# Patient Record
Sex: Female | Born: 1995 | Race: Black or African American | Hispanic: No | Marital: Single | Smoking: Never smoker
Health system: Southern US, Community
[De-identification: ages and names within clinical notes are randomized; demographics above are authoritative.]

## PROBLEM LIST (undated history)

## (undated) DIAGNOSIS — Z9289 Personal history of other medical treatment: Secondary | ICD-10-CM

## (undated) DIAGNOSIS — B009 Herpesviral infection, unspecified: Secondary | ICD-10-CM

## (undated) DIAGNOSIS — N611 Abscess of the breast and nipple: Secondary | ICD-10-CM

## (undated) DIAGNOSIS — Z23 Encounter for immunization: Secondary | ICD-10-CM

## (undated) HISTORY — DX: Encounter for immunization: Z23

## (undated) HISTORY — DX: Abscess of the breast and nipple: N61.1

## (undated) HISTORY — DX: Herpesviral infection, unspecified: B00.9

## (undated) HISTORY — DX: Personal history of other medical treatment: Z92.89

---
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
## 2013-06-26 ENCOUNTER — Ambulatory Visit: Payer: Self-pay | Admitting: Pediatrics

## 2015-07-08 DIAGNOSIS — Z9289 Personal history of other medical treatment: Secondary | ICD-10-CM

## 2015-07-08 HISTORY — DX: Personal history of other medical treatment: Z92.89

## 2015-07-08 LAB — HM PAP SMEAR: HM Pap smear: NEGATIVE

## 2015-09-06 DIAGNOSIS — B009 Herpesviral infection, unspecified: Secondary | ICD-10-CM

## 2015-09-06 HISTORY — DX: Herpesviral infection, unspecified: B00.9

## 2015-12-07 DIAGNOSIS — N611 Abscess of the breast and nipple: Secondary | ICD-10-CM

## 2015-12-07 HISTORY — PX: INTRAUTERINE DEVICE (IUD) INSERTION: SHX5877

## 2015-12-07 HISTORY — DX: Abscess of the breast and nipple: N61.1

## 2016-06-05 HISTORY — PX: BREAST REDUCTION SURGERY: SHX8

## 2016-12-10 DIAGNOSIS — E669 Obesity, unspecified: Secondary | ICD-10-CM | POA: Insufficient documentation

## 2016-12-10 DIAGNOSIS — Z7189 Other specified counseling: Secondary | ICD-10-CM | POA: Insufficient documentation

## 2016-12-10 DIAGNOSIS — Z7185 Encounter for immunization safety counseling: Secondary | ICD-10-CM | POA: Insufficient documentation

## 2017-03-23 ENCOUNTER — Ambulatory Visit (INDEPENDENT_AMBULATORY_CARE_PROVIDER_SITE_OTHER): Payer: Self-pay | Admitting: Certified Nurse Midwife

## 2017-03-23 ENCOUNTER — Encounter: Payer: Self-pay | Admitting: Certified Nurse Midwife

## 2017-03-23 VITALS — BP 120/74 | HR 81 | Ht 66.0 in | Wt 194.0 lb

## 2017-03-23 DIAGNOSIS — Z124 Encounter for screening for malignant neoplasm of cervix: Secondary | ICD-10-CM

## 2017-03-23 DIAGNOSIS — Z01419 Encounter for gynecological examination (general) (routine) without abnormal findings: Secondary | ICD-10-CM

## 2017-03-23 DIAGNOSIS — Z30431 Encounter for routine checking of intrauterine contraceptive device: Secondary | ICD-10-CM

## 2017-03-23 DIAGNOSIS — N6320 Unspecified lump in the left breast, unspecified quadrant: Secondary | ICD-10-CM

## 2017-03-23 DIAGNOSIS — Z113 Encounter for screening for infections with a predominantly sexual mode of transmission: Secondary | ICD-10-CM

## 2017-03-23 NOTE — Progress Notes (Signed)
Gynecology Annual Exam  PCP: Dr Tereasa Coop Chief Complaint:  Chief Complaint  Patient presents with  . Gynecologic Exam    History of Present Illness: Megan Ramirez presents today for her annual exam. She is a 21 year old African American/Black female, G0 P0000, whose LMP was 03/21/2017. Her menses occur every 1 month, they last 4-5 days, are lite flow, on the [place] IUD. She has not had any intermenstrual spotting. She has occasional dysmenorrhea not requiring medication.  The patient's past medical history is notable for HSV. Since her last annual GYN exam dated 07/08/2015, she has had no herpetic outbreaks. IN July 2017 she had a breast reduction which was complicated by postop abscess in the right breast. She currently is undergoing an evaluation for palpitations.  She is sexually active. She is currently using a Skyla IUD for contraception. It was inserted 12/2015.  Mammogram is not applicable. There is no family history of breast cancer. There is no family history of ovarian cancer. The patient does do occ self breast exams.  The patient does not smoke.  The patient does drink occasionally.  The patient does not use illegal drugs.  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 She has gained 20# since her Christean Grief was inserted. She has a BMI of 31.3 kg/m2 (03/23/17).  The patient does get adequate calcium in her diet.  She has completed the Gardasil vaccine series.  She had a cholesterol screen in Jan 2018 which was normal and her hemoglobin A1C was 5.7%.     Review of Systems: Review of Systems  Constitutional: Negative for chills, fever and weight loss.  HENT: Negative for congestion, sinus pain and sore throat.   Eyes: Negative for blurred vision and pain.  Respiratory: Negative for hemoptysis, shortness of breath and wheezing.   Cardiovascular: Positive for palpitations. Negative for chest pain and leg swelling.  Gastrointestinal:  Negative for abdominal pain, blood in stool, diarrhea, heartburn, nausea and vomiting.  Genitourinary: Negative for dysuria, frequency, hematuria and urgency.  Musculoskeletal: Negative for back pain, joint pain and myalgias.  Skin: Negative for itching and rash.  Neurological: Negative for dizziness, tingling and headaches.  Endo/Heme/Allergies: Negative for environmental allergies and polydipsia. Does not bruise/bleed easily.       Negative for hirsutism. Positive for breast tenderness.   Psychiatric/Behavioral: Negative for depression. The patient is nervous/anxious. The patient does not have insomnia.     Past Medical History:  Past Medical History:  Diagnosis Date  . Breast abscess 2017   on left as a complication to breast reduction surgery  . Herpes simplex 09/2015   HSV 1 on culture vulvovaginitis    Past Surgical History:  Past Surgical History:  Procedure Laterality Date  . BREAST REDUCTION SURGERY  06/2016  . INTRAUTERINE DEVICE (IUD) INSERTION  12/2015   Skyla     Family History:  Family History  Problem Relation Age of Onset  . Hypertension Father   . Hypertension Maternal Grandmother   . Diabetes Maternal Grandfather   . Hypertension Maternal Grandfather   . Stroke Maternal Grandfather   . Renal Disease Paternal Grandfather   . Hypertension Paternal Uncle     Social History:  Social History   Social History  . Marital status: Single    Spouse name: N/A  . Number of children: N/A  . Years of education: N/A   Occupational History  . CNA     pediatrics  at [employer]   Social History Main Topics  . Smoking status: Never Smoker  . Smokeless tobacco: Never Used  . Alcohol use Yes     Comment: occasional  . Drug use: No  . Sexual activity: Yes    Partners: Male    Birth control/ protection: IUD   Other Topics Concern  . Not on file   Social History Narrative  . No narrative on file    Allergies:  No Known Allergies  Medications: Prior to  Admission medications   Medication Sig Start Date End Date Taking? Authorizing Provider  Levonorgestrel (SKYLA) 13.5 MG IUD by Intrauterine route.   Yes Historical Provider, MD   Also occasionally taken biotin, a woman's vitamin, and vitamin D3 Physical Exam Vitals: Blood pressure 120/74, pulse 81, height  (1.676 m), weight 194 lb (88 kg), last menstrual period 03/21/2017.  General: NAD HEENT: normocephalic, anicteric Thyroid: no enlargement, no palpable nodules Pulmonary: No increased work of breathing, CTAB Cardiovascular: RRR without murmur Breast: scars from breast reduction, mild tenderness bilaterally, fullness in upper inner breasts bilaterally with L>R; 1-2 cm round, mobile tender mass in left breast at 5o'clock; no skin or nipple retraction present, no nipple discharge.  No axillary, infraclavicular, or supraclavicular lymphadenopathy. Abdomen:  soft, non-tender, non-distended.  Umbilicus without lesions.  No hepatomegaly or masses palpable. No evidence of hernia  Genitourinary:  External: Normal external female genitalia.  Normal urethral meatus, normal Bartholin's and Skene's glands.    Vagina: Normal vaginal mucosa, no evidence of prolapse.    Cervix: Grossly normal in appearance, small amount bleeding, IUD string visible, NO CMT  Uterus: Non-enlarged, mobile, normal contour, midplane  Adnexa: ovaries non-enlarged, no adnexal masses  Rectal: deferred  Lymphatic: no evidence of inguinal lymphadenopathy Extremities: no edema, erythema, or tenderness Neurologic: Grossly intact Psychiatric: mood appropriate, affect full      Assessment: 21 y.o. G0P0000 well woman exam IUD in place Left breast mass  Plan:   1)RTO in 7-10 days for repeat breast exam. If mass persists, will order breast ultrasound +/- surgical consult  2) Paptima done  3) Routine healthcare maintenance including cholesterol, diabetes screening  managed by PCP  Farrel Conners,  CNM

## 2017-03-28 LAB — PAP IG, CT-NG, RFX HPV ALL
Chlamydia, Nuc. Acid Amp: NEGATIVE
GONOCOCCUS BY NUCLEIC ACID AMP: NEGATIVE
PAP SMEAR COMMENT: 0

## 2017-03-30 ENCOUNTER — Encounter: Payer: Self-pay | Admitting: Certified Nurse Midwife

## 2017-03-30 ENCOUNTER — Ambulatory Visit (INDEPENDENT_AMBULATORY_CARE_PROVIDER_SITE_OTHER): Payer: 59 | Admitting: Certified Nurse Midwife

## 2017-03-30 VITALS — BP 112/72 | HR 97 | Ht 66.0 in | Wt 195.0 lb

## 2017-03-30 DIAGNOSIS — N6323 Unspecified lump in the left breast, lower outer quadrant: Secondary | ICD-10-CM | POA: Diagnosis not present

## 2017-03-30 DIAGNOSIS — N6320 Unspecified lump in the left breast, unspecified quadrant: Secondary | ICD-10-CM | POA: Diagnosis not present

## 2017-03-30 DIAGNOSIS — N6322 Unspecified lump in the left breast, upper inner quadrant: Secondary | ICD-10-CM

## 2017-03-30 NOTE — Progress Notes (Signed)
Obstetrics & Gynecology Office Visit   Chief Complaint:  Chief Complaint  Patient presents with  . Breast Problem    follow up breast exam    History of Present Illness:21 year old G0 who presents for a breast exam. She had a left breast mass at 5 o'clock and fullness in the inner upper quadrants of both breasts on a recent annual exam and had just started her menses. She had a breast reduction surgery last year followed by a right breast abscess. She has not been able to feel any breast masses.    Review of Systems:  Review of Systems  Constitutional: Negative for chills and fever.  Respiratory: Negative.   Cardiovascular: Positive for palpitations.  Skin: Negative for rash.     Past Medical History:  Past Medical History:  Diagnosis Date  . Breast abscess 2017   on left as a complication to breast reduction surgery  . Herpes simplex 09/2015   HSV 1 on culture vulvovaginitis    Past Surgical History:  Past Surgical History:  Procedure Laterality Date  . BREAST REDUCTION SURGERY  06/2016  . INTRAUTERINE DEVICE (IUD) INSERTION  12/2015   Skyla    Gynecologic History: Patient's last menstrual period was 03/21/2017 (exact date).  Obstetric History: G0P0000  Family History:  Family History  Problem Relation Age of Onset  . Hypertension Father   . Hypertension Maternal Grandmother   . Diabetes Maternal Grandfather   . Hypertension Maternal Grandfather   . Stroke Maternal Grandfather   . Renal Disease Paternal Grandfather   . Hypertension Paternal Uncle     Social History:  Social History   Social History  . Marital status: Single    Spouse name: N/A  . Number of children: N/A  . Years of education: N/A   Occupational History  . CNA     pediatrics at [employer]   Social History Main Topics  . Smoking status: Never Smoker  . Smokeless tobacco: Never Used  . Alcohol use Yes     Comment: occasional  . Drug use: No  . Sexual activity: Yes    Partners: Male      Birth control/ protection: IUD   Other Topics Concern  . Not on file   Social History Narrative  . No narrative on file    Allergies:  No Known Allergies  Medications: Prior to Admission medications   Medication Sig Start Date End Date Taking? Authorizing Provider  Levonorgestrel (SKYLA) 13.5 MG IUD by Intrauterine route.    Historical Provider, MD    Physical Exam Vitals: BP 112/72   Pulse 97   Ht  (1.676 m)   Wt 195 lb (88.5 kg)   LMP 03/21/2017 (Exact Date)   BMI 31.47 kg/m   Patient's last menstrual period was 03/21/2017 (exact date).  Physical Exam  Constitutional: She appears well-developed and well-nourished. No distress.  Respiratory: Right breast exhibits skin change (scars from breast reduction). Right breast exhibits no inverted nipple, no mass, no nipple discharge and no tenderness. Left breast exhibits mass (4 cm round mass, NT at 10 o'clock; 1-2 cm round mass at 5 o'clock, NT) and skin change (scar from recent breast reduction). Left breast exhibits no inverted nipple, no nipple discharge and no tenderness.    Lymphadenopathy:    She has no axillary adenopathy.       Right: No supraclavicular adenopathy present.       Left: No supraclavicular adenopathy present.  No infraclavicular adenopathy  Assessment: 21 y.o. G0P0000 with left breast mass x 2  Plan: Problem List Items Addressed This Visit    None    Visit Diagnoses    Mass of upper inner quadrant of left breast    -  Primary   Relevant Orders   US BREAST LTD UNI LEFT INC AXILLA   Mass of lower outer quadrant of left breast       Relevant Orders   US BREAST LTD UNI LEFT INC AXILLA     Advised patient that given her age and her exam, this is most likely a benign process. Will certainly evaluate with a ultrasound on the left breast. If ultrasound is concerning, will refer to Dr Lemar Livings for further evaluation.   Farrel Conners, CNM

## 2017-04-11 ENCOUNTER — Ambulatory Visit
Admission: RE | Admit: 2017-04-11 | Discharge: 2017-04-11 | Disposition: A | Discharge status: Home / Self Care | Payer: Self-pay | Source: Ambulatory Visit | Attending: Certified Nurse Midwife | Admitting: Certified Nurse Midwife

## 2017-04-11 DIAGNOSIS — N6322 Unspecified lump in the left breast, upper inner quadrant: Secondary | ICD-10-CM | POA: Insufficient documentation

## 2017-04-11 DIAGNOSIS — N6323 Unspecified lump in the left breast, lower outer quadrant: Secondary | ICD-10-CM | POA: Insufficient documentation

## 2017-08-11 ENCOUNTER — Encounter: Payer: Self-pay | Admitting: Advanced Practice Midwife

## 2017-08-11 ENCOUNTER — Ambulatory Visit (INDEPENDENT_AMBULATORY_CARE_PROVIDER_SITE_OTHER): Payer: BC Managed Care – PPO | Admitting: Advanced Practice Midwife

## 2017-08-11 VITALS — BP 120/80 | HR 60 | Ht 66.0 in | Wt 205.0 lb

## 2017-08-11 DIAGNOSIS — Z30011 Encounter for initial prescription of contraceptive pills: Secondary | ICD-10-CM | POA: Diagnosis not present

## 2017-08-11 DIAGNOSIS — Z30432 Encounter for removal of intrauterine contraceptive device: Secondary | ICD-10-CM | POA: Diagnosis not present

## 2017-08-11 HISTORY — PX: OTHER SURGICAL HISTORY: SHX169

## 2017-08-11 MED ORDER — NORETHIN ACE-ETH ESTRAD-FE 1-20 MG-MCG PO TABS: 1.0000 | ORAL_TABLET | Freq: Every day | ORAL | 11 refills | Status: DC

## 2017-08-11 NOTE — Progress Notes (Signed)
    GYNECOLOGY OFFICE PROCEDURE NOTE  Megan Ramirez is a 21 y.o. G0P0000 here for IUD removal. The patient currently has a Skyla IUD placed on January 2017.  No GYN concerns.  Last pap smear was on April 2018 and was normal. She has had increased odor with the IUD and also has had a regular period since placement. She is mainly concerned about her weight gain since the IUD was placed. She would like to try oral combined pills now.   IUD Removal Patient identified, informed consent performed, consent signed. Time out was performed. Speculum placed in the vagina. The strings of the IUD were grasped and pulled using ring forceps. The IUD was successfully removed in its entirety.  Patient tolerated procedure well.   Patient was given oral contraceptive information.  Megan Ramirez, CNM

## 2017-08-11 NOTE — Patient Instructions (Signed)
Oral Contraception Information Oral contraceptive pills (OCPs) are medicines taken to prevent pregnancy. OCPs work by preventing the ovaries from releasing eggs. The hormones in OCPs also cause the cervical mucus to thicken, preventing the sperm from entering the uterus. The hormones also cause the uterine lining to become thin, not allowing a fertilized egg to attach to the inside of the uterus. OCPs are highly effective when taken exactly as prescribed. However, OCPs do not prevent sexually transmitted diseases (STDs). Safe sex practices, such as using condoms along with the pill, can help prevent STDs. Before taking the pill, you may have a physical exam and Pap test. Your health care provider may order blood tests. The health care provider will make sure you are a good candidate for oral contraception. Discuss with your health care provider the possible side effects of the OCP you may be prescribed. When starting an OCP, it can take 2 to 3 months for the body to adjust to the changes in hormone levels in your body. Types of oral contraception  The combination pill-This pill contains estrogen and progestin (synthetic progesterone) hormones. The combination pill comes in 21-day, 28-day, or 91-day packs. Some types of combination pills are meant to be taken continuously (365-day pills). With 21-day packs, you do not take pills for 7 days after the last pill. With 28-day packs, the pill is taken every day. The last 7 pills are without hormones. Certain types of pills have more than 21 hormone-containing pills. With 91-day packs, the first 84 pills contain both hormones, and the last 7 pills contain no hormones or contain estrogen only.  The minipill-This pill contains the progesterone hormone only. The pill is taken every day continuously. It is very important to take the pill at the same time each day. The minipill comes in packs of 28 pills. All 28 pills contain the hormone. Advantages of oral  contraceptive pills  Decreases premenstrual symptoms.  Treats menstrual period cramps.  Regulates the menstrual cycle.  Decreases a heavy menstrual flow.  May treatacne, depending on the type of pill.  Treats abnormal uterine bleeding.  Treats polycystic ovarian syndrome.  Treats endometriosis.  Can be used as emergency contraception. Things that can make oral contraceptive pills less effective OCPs can be less effective if:  You forget to take the pill at the same time every day.  You have a stomach or intestinal disease that lessens the absorption of the pill.  You take OCPs with other medicines that make OCPs less effective, such as antibiotics, certain HIV medicines, and some seizure medicines.  You take expired OCPs.  You forget to restart the pill on day 7, when using the packs of 21 pills.  Risks associated with oral contraceptive pills Oral contraceptive pills can sometimes cause side effects, such as:  Headache.  Nausea.  Breast tenderness.  Irregular bleeding or spotting.  Combination pills are also associated with a small increased risk of:  Blood clots.  Heart attack.  Stroke.  This information is not intended to replace advice given to you by your health care provider. Make sure you discuss any questions you have with your health care provider. Document Released: 02/12/2003 Document Revised: 04/29/2016 Document Reviewed: 05/13/2013 Elsevier Interactive Patient Education  2018 Elsevier Inc.  

## 2017-12-05 DIAGNOSIS — R079 Chest pain, unspecified: Secondary | ICD-10-CM | POA: Diagnosis not present

## 2017-12-05 DIAGNOSIS — R000 Tachycardia, unspecified: Secondary | ICD-10-CM | POA: Diagnosis not present

## 2017-12-05 DIAGNOSIS — Z79899 Other long term (current) drug therapy: Secondary | ICD-10-CM | POA: Insufficient documentation

## 2017-12-05 DIAGNOSIS — R002 Palpitations: Secondary | ICD-10-CM | POA: Insufficient documentation

## 2017-12-05 NOTE — ED Triage Notes (Signed)
Patient ambulatory to triage with steady gait, without difficulty or distress noted; pt reports feels heart fluttering with hx of same; st wore holter monitor in past which was WNL; denies any accomp symptoms

## 2017-12-06 ENCOUNTER — Encounter: Payer: Self-pay | Admitting: Emergency Medicine

## 2017-12-06 ENCOUNTER — Emergency Department
Admission: EM | Admit: 2017-12-06 | Discharge: 2017-12-06 | Disposition: A | Discharge status: Home / Self Care | Payer: 59 | Attending: Emergency Medicine | Admitting: Emergency Medicine

## 2017-12-06 ENCOUNTER — Other Ambulatory Visit: Payer: Self-pay

## 2017-12-06 ENCOUNTER — Emergency Department: Payer: 59

## 2017-12-06 DIAGNOSIS — R079 Chest pain, unspecified: Secondary | ICD-10-CM | POA: Diagnosis not present

## 2017-12-06 DIAGNOSIS — R002 Palpitations: Secondary | ICD-10-CM

## 2017-12-06 DIAGNOSIS — Z79899 Other long term (current) drug therapy: Secondary | ICD-10-CM | POA: Diagnosis not present

## 2017-12-06 LAB — BASIC METABOLIC PANEL
ANION GAP: 11 (ref 5–15)
BUN: 13 mg/dL (ref 6–20)
CALCIUM: 9.3 mg/dL (ref 8.9–10.3)
CO2: 23 mmol/L (ref 22–32)
CREATININE: 0.77 mg/dL (ref 0.44–1.00)
Chloride: 101 mmol/L (ref 101–111)
GFR calc Af Amer: >60 mL/min (ref >60)
GLUCOSE: 135 mg/dL — AB (ref 65–99)
Potassium: 3.8 mmol/L (ref 3.5–5.1)
Sodium: 135 mmol/L (ref 135–145)

## 2017-12-06 LAB — CBC
HCT: 42.5 % (ref 35.0–47.0)
HEMOGLOBIN: 14.3 g/dL (ref 12.0–16.0)
MCH: 27.9 pg (ref 26.0–34.0)
MCHC: 33.6 g/dL (ref 32.0–36.0)
MCV: 83 fL (ref 80.0–100.0)
PLATELETS: 282 10*3/uL (ref 150–440)
RBC: 5.12 MIL/uL (ref 3.80–5.20)
RDW: 12.7 % (ref 11.5–14.5)
WBC: 7.8 10*3/uL (ref 3.6–11.0)

## 2017-12-06 LAB — TROPONIN I

## 2017-12-06 NOTE — ED Provider Notes (Signed)
[facility] Emergency Department Provider Note ___   First MD Initiated Contact with Patient 12/06/17 0210     (approximate)  I have reviewed the triage vital signs and the nursing notes.   HISTORY  Chief Complaint Palpitations    HPI Megan Ramirez is a 22 y.o. female presents to the emergency department with acute onset of rapid heartbeat with occasional palpitations which patient describes as heart fluttering while driving tonight.  Patient states that she checked her pulse via her apple watch at which point it was 199 at rest.  Patient denies any chest pain no shortness of breath no dizziness or diaphoresis.  Patient denies any lower extremity pain or swelling.  Of note patient's mother has a history of ventricular tachycardia.  Notable family history of CAD.   Past Medical History:  Diagnosis Date  . Breast abscess 2017   on left as a complication to breast reduction surgery  . Herpes simplex 09/2015   HSV 1 on culture vulvovaginitis    Patient Active Problem List   Diagnosis Date Noted  . Left breast mass 03/30/2017  . Vaccine counseling 12/10/2016  . Obesity (BMI 30-39.9) 12/10/2016    Past Surgical History:  Procedure Laterality Date  . BREAST REDUCTION SURGERY  06/2016  . INTRAUTERINE DEVICE (IUD) INSERTION  12/2015   Skyla  . iud removal  08/11/2017    Prior to Admission medications   Medication Sig Start Date End Date Taking? Authorizing Provider  norethindrone-ethinyl estradiol (JUNEL FE 1/20) 1-20 MG-MCG tablet Take 1 tablet by mouth daily. 08/11/17   Tresea MallGledhill, Jane, CNM    Allergies No known drug allergies  Family History  Problem Relation Age of Onset  . Hypertension Father   . Hypertension Maternal Grandmother   . Diabetes Maternal Grandfather   . Hypertension Maternal Grandfather   . Stroke Maternal Grandfather   . Renal Disease Paternal Grandfather   . Hypertension Paternal Uncle     Social History Social  History   Tobacco Use  . Smoking status: Never Smoker  . Smokeless tobacco: Never Used  Substance Use Topics  . Alcohol use: Yes    Comment: occasional  . Drug use: No    Review of Systems Constitutional: No fever/chills Eyes: No visual changes. ENT: No sore throat. Cardiovascular: Denies chest pain. Respiratory: Denies shortness of breath. Gastrointestinal: No abdominal pain.  No nausea, no vomiting.  No diarrhea.  No constipation. Genitourinary: Negative for dysuria. Musculoskeletal: Negative for neck pain.  Negative for back pain. Integumentary: Negative for rash. Neurological: Negative for headaches, focal weakness or numbness.   ____________________________________________   PHYSICAL EXAM:  VITAL SIGNS: ED Triage Vitals  Enc Vitals Group     BP 12/06/17 0001 (!) 150/98     Pulse Rate 12/06/17 0001 (!) 108     Resp 12/06/17 0001 20     Temp 12/06/17 0001 98 F (36.7 C)     Temp Source 12/06/17 0001 Oral     SpO2 12/06/17 0001 99 %     Weight 12/05/17 2359 90.7 kg (200 lb)     Height 12/05/17 2359 1.676 m (5\' 6" )     Head Circumference --      Peak Flow --      Pain Score --      Pain Loc --      Pain Edu? --      Excl. in GC? --     Constitutional: Alert and oriented. Well appearing and  in no acute distress. Eyes: Conjunctivae are normal.  Head: Atraumatic. Mouth/Throat: Mucous membranes are moist.  Oropharynx non-erythematous. Neck: No stridor.   Cardiovascular: Tachycardia, regular rhythm. Good peripheral circulation. Grossly normal heart sounds. Respiratory: Normal respiratory effort.  No retractions. Lungs CTAB. Gastrointestinal: Soft and nontender. No distention.  Musculoskeletal: No lower extremity tenderness nor edema. No gross deformities of extremities. Neurologic:  Normal speech and language. No gross focal neurologic deficits are appreciated.  Skin:  Skin is warm, dry and intact. No rash noted. Psychiatric: Mood and affect are normal. Speech  and behavior are normal. ____________________________________________   LABS (all labs ordered are listed, but only abnormal results are displayed)  Labs Reviewed  BASIC METABOLIC PANEL - Abnormal; Notable for the following components:      Result Value   Glucose, Bld 135 (*)    All other components within normal limits  CBC  TROPONIN I  POC URINE PREG, ED   ____________________________________________  EKG  ED ECG REPORT I, [place] N Alajiah Dutkiewicz, the attending physician, personally viewed and interpreted this ECG.   Date: 12/06/2017  EKG Time: 12:21 AM  Rate: 107  Rhythm: Sinus tachycardia  Axis: Normal  Intervals: Normal  ST&T Change: None  ____________________________________________  RADIOLOGY I, Edisto Dewayne Shorter, personally viewed and evaluated these images (plain radiographs) as part of my medical decision making, as well as reviewing the written report by the radiologist.  Dg Chest 2 View  Result Date: 12/06/2017 CLINICAL DATA:  Heart palpitations.  Chest pain. EXAM: CHEST  2 VIEW COMPARISON:  None. FINDINGS: The heart size and mediastinal contours are within normal limits. Both lungs are clear. The visualized skeletal structures are unremarkable. IMPRESSION: No active cardiopulmonary disease. Electronically Signed   By: Burman Nieves M.D.   On: 12/06/2017 00:17    ___________________  Procedures   ____________________________________________   INITIAL IMPRESSION / ASSESSMENT AND PLAN / ED COURSE  As part of my medical decision making, I reviewed the following data within the electronic MEDICAL RECORD 52 year old female presented with above-stated history of rapid heartbeat with palpitations.  Patient has a history of previous heart palpitation for which she wore a Holter monitor for 48 hours.  Given family history (mother) of SVT will refer patient to cardiology for further outpatient evaluation and  management. ____________________________________________  FINAL CLINICAL IMPRESSION(S) / ED DIAGNOSES  Final diagnoses:  Heart palpitations     MEDICATIONS GIVEN DURING THIS VISIT:  Medications - No data to display   ED Discharge Orders    None       Note:  This document was prepared using Dragon voice recognition software and may include unintentional dictation errors.    Darci Current, MD 12/06/17 [phone]

## 2017-12-06 NOTE — ED Notes (Signed)
Pt ambulated down hall with heart monitor with this RN assisting. Pt's HR got up to 121bpm. Pt assisted back in bed and BP cycled. MD notified

## 2017-12-06 NOTE — ED Notes (Signed)
ED Provider at bedside. 

## 2017-12-06 NOTE — ED Notes (Signed)
EDT in triage room for pt protocols, however, pt has been transported to xray

## 2017-12-06 NOTE — ED Notes (Signed)
Pt verbalizes understanding of discharge instructions.

## 2017-12-06 NOTE — ED Notes (Signed)
Pt returns to triage for protocols

## 2017-12-20 DIAGNOSIS — R002 Palpitations: Secondary | ICD-10-CM | POA: Diagnosis not present

## 2017-12-23 DIAGNOSIS — I493 Ventricular premature depolarization: Secondary | ICD-10-CM | POA: Diagnosis not present

## 2017-12-29 DIAGNOSIS — R002 Palpitations: Secondary | ICD-10-CM | POA: Diagnosis not present

## 2018-01-05 DIAGNOSIS — R000 Tachycardia, unspecified: Secondary | ICD-10-CM | POA: Diagnosis not present

## 2018-01-05 DIAGNOSIS — R002 Palpitations: Secondary | ICD-10-CM | POA: Diagnosis not present

## 2018-01-27 DIAGNOSIS — J069 Acute upper respiratory infection, unspecified: Secondary | ICD-10-CM | POA: Diagnosis not present

## 2018-05-15 DIAGNOSIS — Z111 Encounter for screening for respiratory tuberculosis: Secondary | ICD-10-CM | POA: Diagnosis not present

## 2018-05-15 DIAGNOSIS — Z0000 Encounter for general adult medical examination without abnormal findings: Secondary | ICD-10-CM | POA: Diagnosis not present

## 2018-05-23 DIAGNOSIS — Z0000 Encounter for general adult medical examination without abnormal findings: Secondary | ICD-10-CM | POA: Diagnosis not present

## 2018-05-23 DIAGNOSIS — R7303 Prediabetes: Secondary | ICD-10-CM | POA: Insufficient documentation

## 2018-06-01 ENCOUNTER — Other Ambulatory Visit: Payer: Self-pay | Admitting: Advanced Practice Midwife

## 2018-06-01 DIAGNOSIS — Z30011 Encounter for initial prescription of contraceptive pills: Secondary | ICD-10-CM

## 2018-07-25 ENCOUNTER — Ambulatory Visit: Payer: BC Managed Care – PPO | Admitting: Certified Nurse Midwife

## 2018-08-22 ENCOUNTER — Other Ambulatory Visit: Payer: Self-pay | Admitting: Advanced Practice Midwife

## 2018-08-22 DIAGNOSIS — Z23 Encounter for immunization: Secondary | ICD-10-CM | POA: Diagnosis not present

## 2018-08-22 DIAGNOSIS — Z30011 Encounter for initial prescription of contraceptive pills: Secondary | ICD-10-CM

## 2018-08-31 ENCOUNTER — Other Ambulatory Visit: Payer: Self-pay | Admitting: Advanced Practice Midwife

## 2018-08-31 DIAGNOSIS — Z30011 Encounter for initial prescription of contraceptive pills: Secondary | ICD-10-CM

## 2018-08-31 NOTE — Telephone Encounter (Signed)
Please advise 

## 2018-09-01 NOTE — Telephone Encounter (Signed)
Was approved 08/22/18 #[id number].  Pt needs annual exam.

## 2018-11-23 ENCOUNTER — Ambulatory Visit (INDEPENDENT_AMBULATORY_CARE_PROVIDER_SITE_OTHER): Payer: 59 | Admitting: Advanced Practice Midwife

## 2018-11-23 ENCOUNTER — Encounter: Payer: Self-pay | Admitting: Advanced Practice Midwife

## 2018-11-23 ENCOUNTER — Other Ambulatory Visit: Payer: Self-pay

## 2018-11-23 VITALS — BP 128/70 | HR 80 | Ht 66.0 in | Wt 216.0 lb

## 2018-11-23 DIAGNOSIS — Z01419 Encounter for gynecological examination (general) (routine) without abnormal findings: Secondary | ICD-10-CM | POA: Diagnosis not present

## 2018-11-23 DIAGNOSIS — Z3041 Encounter for surveillance of contraceptive pills: Secondary | ICD-10-CM

## 2018-11-23 DIAGNOSIS — Z0000 Encounter for general adult medical examination without abnormal findings: Secondary | ICD-10-CM

## 2018-11-23 MED ORDER — NORGESTIMATE-ETH ESTRADIOL 0.25-35 MG-MCG PO TABS: 1.0000 | ORAL_TABLET | Freq: Every day | ORAL | 4 refills | Status: DC

## 2018-11-23 NOTE — Patient Instructions (Signed)
American Heart Association (AHA) Exercise Recommendation  Being physically active is important to prevent heart disease and stroke, the nation's No. 1and No. 5killers. To improve overall cardiovascular health, we suggest at least 150 minutes per week of moderate exercise or 75 minutes per week of vigorous exercise (or a combination of moderate and vigorous activity). Thirty minutes a day, five times a week is an easy goal to remember. You will also experience benefits even if you divide your time into two or three segments of 10 to 15 minutes per day.  For people who would benefit from lowering their blood pressure or cholesterol, we recommend 40 minutes of aerobic exercise of moderate to vigorous intensity three to four times a week to lower the risk for heart attack and stroke.  Physical activity is anything that makes you move your body and burn calories.  This includes things like climbing stairs or playing sports. Aerobic exercises benefit your heart, and include walking, jogging, swimming or biking. Strength and stretching exercises are best for overall stamina and flexibility.  The simplest, positive change you can make to effectively improve your heart health is to start walking. It's enjoyable, free, easy, social and great exercise. A walking program is flexible and boasts high success rates because people can stick with it. It's easy for walking to become a regular and satisfying part of life.   For Overall Cardiovascular Health:  At least 30 minutes of moderate-intensity aerobic activity at least 5 days per week for a total of 150  OR   At least 25 minutes of vigorous aerobic activity at least 3 days per week for a total of 75 minutes; or a combination of moderate- and vigorous-intensity aerobic activity  AND   Moderate- to high-intensity muscle-strengthening activity at least 2 days per week for additional health benefits.  For Lowering Blood Pressure and Cholesterol  An  average 40 minutes of moderate- to vigorous-intensity aerobic activity 3 or 4 times per week  What if I can't make it to the time goal? Something is always better than nothing! And everyone has to start somewhere. Even if you've been sedentary for years, today is the day you can begin to make healthy changes in your life. If you don't think you'll make it for 30 or 40 minutes, set a reachable goal for today. You can work up toward your overall goal by increasing your time as you get stronger. Don't let all-or-nothing thinking rob you of doing what you can every day.  Source:http://www.heart.org   Mediterranean Diet A Mediterranean diet refers to food and lifestyle choices that are based on the traditions of countries located on the [employer]. This way of eating has been shown to help prevent certain conditions and improve outcomes for people who have chronic diseases, like kidney disease and heart disease. What are tips for following this plan? Lifestyle  Cook and eat meals together with your family, when possible.  Drink enough fluid to keep your urine clear or pale yellow.  Be physically active every day. This includes: ? Aerobic exercise like running or swimming. ? Leisure activities like gardening, walking, or housework.  Get 7-8 hours of sleep each night.  If recommended by your health care provider, drink red wine in moderation. This means 1 glass a day for nonpregnant women and 2 glasses a day for men. A glass of wine equals 5 oz (150 mL). Reading food labels   Check the serving size of packaged foods. For foods such as  rice and pasta, the serving size refers to the amount of cooked product, not dry.  Check the total fat in packaged foods. Avoid foods that have saturated fat or trans fats.  Check the ingredients list for added sugars, such as corn syrup. Shopping  At the grocery store, buy most of your food from the areas near the walls of the store. This  includes: ? Fresh fruits and vegetables (produce). ? Grains, beans, nuts, and seeds. Some of these may be available in unpackaged forms or large amounts (in bulk). ? Fresh seafood. ? Poultry and eggs. ? Low-fat dairy products.  Buy whole ingredients instead of prepackaged foods.  Buy fresh fruits and vegetables in-season from local farmers markets.  Buy frozen fruits and vegetables in resealable bags.  If you do not have access to quality fresh seafood, buy precooked frozen shrimp or canned fish, such as tuna, salmon, or sardines.  Buy small amounts of raw or cooked vegetables, salads, or olives from the deli or salad bar at your store.  Stock your pantry so you always have certain foods on hand, such as olive oil, canned tuna, canned tomatoes, rice, pasta, and beans. Cooking  Cook foods with extra-virgin olive oil instead of using butter or other vegetable oils.  Have meat as a side dish, and have vegetables or grains as your main dish. This means having meat in small portions or adding small amounts of meat to foods like pasta or stew.  Use beans or vegetables instead of meat in common dishes like chili or lasagna.  Experiment with different cooking methods. Try roasting or broiling vegetables instead of steaming or sauteing them.  Add frozen vegetables to soups, stews, pasta, or rice.  Add nuts or seeds for added healthy fat at each meal. You can add these to yogurt, salads, or vegetable dishes.  Marinate fish or vegetables using olive oil, lemon juice, garlic, and fresh herbs. Meal planning   Plan to eat 1 vegetarian meal one day each week. Try to work up to 2 vegetarian meals, if possible.  Eat seafood 2 or more times a week.  Have healthy snacks readily available, such as: ? Vegetable sticks with hummus. ? [place] yogurt. ? Fruit and nut trail mix.  Eat balanced meals throughout the week. This includes: ? Fruit: 2-3 servings a day ? Vegetables: 4-5 servings a  day ? Low-fat dairy: 2 servings a day ? Fish, poultry, or lean meat: 1 serving a day ? Beans and legumes: 2 or more servings a week ? Nuts and seeds: 1-2 servings a day ? Whole grains: 6-8 servings a day ? Extra-virgin olive oil: 3-4 servings a day  Limit red meat and sweets to only a few servings a month What are my food choices?  Mediterranean diet ? Recommended ? Grains: Whole-grain pasta. Brown rice. Bulgar wheat. Polenta. Couscous. Whole-wheat bread. [place]. ? Vegetables: Artichokes. Beets. Broccoli. Cabbage. Carrots. Eggplant. Green beans. Chard. Kale. Spinach. Onions. Leeks. Peas. Squash. Tomatoes. Peppers. Radishes. ? Fruits: Apples. Apricots. Avocado. Berries. Bananas. Cherries. Dates. Figs. Grapes. Lemons. Melon. Oranges. Peaches. Plums. Pomegranate. ? Meats and other protein foods: Beans. Almonds. Sunflower seeds. Pine nuts. Peanuts. [place]. Salmon. Scallops. Shrimp. [place]. Tilapia. Clams. Oysters. Eggs. ? Dairy: Low-fat milk. Cheese. Greek yogurt. ? Beverages: Water. Red wine. Herbal tea. ? Fats and oils: Extra virgin olive oil. Avocado oil. Grape seed oil. ? Sweets and desserts: [place] yogurt with honey. Baked apples. Poached pears. Trail mix. ? Seasoning and other foods: Basil.  Cilantro. Coriander. Cumin. Mint. Parsley. Sage. Rosemary. Tarragon. Garlic. Oregano. Thyme. Pepper. Balsalmic vinegar. Tahini. Hummus. Tomato sauce. Olives. Mushrooms. ? Limit these ? Grains: Prepackaged pasta or rice dishes. Prepackaged cereal with added sugar. ? Vegetables: Deep fried potatoes (french fries). ? Fruits: Fruit canned in syrup. ? Meats and other protein foods: Beef. Pork. Lamb. Poultry with skin. Hot dogs. [place] Salines. ? Dairy: Ice cream. Sour cream. Whole milk. ? Beverages: Juice. Sugar-sweetened soft drinks. Beer. Liquor and spirits. ? Fats and oils: Butter. Canola oil. Vegetable oil. Beef fat (tallow). Lard. ? Sweets and desserts: Cookies. Cakes. Pies. Candy. ? Seasoning and other  foods: Mayonnaise. Premade sauces and marinades. ? The items listed may not be a complete list. Talk with your dietitian about what dietary choices are right for you. Summary  The Mediterranean diet includes both food and lifestyle choices.  Eat a variety of fresh fruits and vegetables, beans, nuts, seeds, and whole grains.  Limit the amount of red meat and sweets that you eat.  Talk with your health care provider about whether it is safe for you to drink red wine in moderation. This means 1 glass a day for nonpregnant women and 2 glasses a day for men. A glass of wine equals 5 oz (150 mL). This information is not intended to replace advice given to you by your health care provider. Make sure you discuss any questions you have with your health care provider. Document Released: 07/15/2016 Document Revised: 08/17/2016 Document Reviewed: 07/15/2016 Elsevier Interactive Patient Education  2019 [place] 18-39 Years, Female Preventive care refers to lifestyle choices and visits with your health care provider that can promote health and wellness. What does preventive care include?   A yearly physical exam. This is also called an annual well check.  Dental exams once or twice a year.  Routine eye exams. Ask your health care provider how often you should have your eyes checked.  Personal lifestyle choices, including: ? Daily care of your teeth and gums. ? Regular physical activity. ? Eating a healthy diet. ? Avoiding tobacco and drug use. ? Limiting alcohol use. ? Practicing safe sex. ? Taking vitamin and mineral supplements as recommended by your health care provider. What happens during an annual well check? The services and screenings done by your health care provider during your annual well check will depend on your age, overall health, lifestyle risk factors, and family history of disease. Counseling Your health care provider may ask you questions about  your:  Alcohol use.  Tobacco use.  Drug use.  Emotional well-being.  Home and relationship well-being.  Sexual activity.  Eating habits.  Work and work Statistician.  Method of birth control.  Menstrual cycle.  Pregnancy history. Screening You may have the following tests or measurements:  Height, weight, and BMI.  Diabetes screening. This is done by checking your blood sugar (glucose) after you have not eaten for a while (fasting).  Blood pressure.  Lipid and cholesterol levels. These may be checked every 5 years starting at age 38.  Skin check.  Hepatitis C blood test.  Hepatitis B blood test.  Sexually transmitted disease (STD) testing.  BRCA-related cancer screening. This may be done if you have a family history of breast, ovarian, tubal, or peritoneal cancers.  Pelvic exam and Pap test. This may be done every 3 years starting at age 43. Starting at age 48, this may be done every 5 years if you have a Pap test in combination with  an HPV test. Discuss your test results, treatment options, and if necessary, the need for more tests with your health care provider. Vaccines Your health care provider may recommend certain vaccines, such as:  Influenza vaccine. This is recommended every year.  Tetanus, diphtheria, and acellular pertussis (Tdap, Td) vaccine. You may need a Td booster every 10 years.  Varicella vaccine. You may need this if you have not been vaccinated.  HPV vaccine. If you are 38 or younger, you may need three doses over 6 months.  Measles, mumps, and rubella (MMR) vaccine. You may need at least one dose of MMR. You may also need a second dose.  Pneumococcal 13-valent conjugate (PCV13) vaccine. You may need this if you have certain conditions and were not previously vaccinated.  Pneumococcal polysaccharide (PPSV23) vaccine. You may need one or two doses if you smoke cigarettes or if you have certain conditions.  Meningococcal vaccine. One  dose is recommended if you are age 60-21 years and a first-year college student living in a residence hall, or if you have one of several medical conditions. You may also need additional booster doses.  Hepatitis A vaccine. You may need this if you have certain conditions or if you travel or work in places where you may be exposed to hepatitis A.  Hepatitis B vaccine. You may need this if you have certain conditions or if you travel or work in places where you may be exposed to hepatitis B.  Haemophilus influenzae type b (Hib) vaccine. You may need this if you have certain risk factors. Talk to your health care provider about which screenings and vaccines you need and how often you need them. This information is not intended to replace advice given to you by your health care provider. Make sure you discuss any questions you have with your health care provider. Document Released: 01/18/2002 Document Revised: 07/05/2017 Document Reviewed: 09/23/2015 Elsevier Interactive Patient Education  2019 [employer].

## 2018-11-23 NOTE — Progress Notes (Signed)
Patient ID: Megan Ramirez, female   DOB: 11/13/1996, 22 y.o.   MRN: [id number]     Gynecology Annual Exam  PCP: [facility]  Chief Complaint:  Chief Complaint  Patient presents with  . Gynecologic Exam    thinks she had a ruptured cyst in August    History of Present Illness: Patient is a 22 y.o. G0P0000 presents for annual exam. The patient has complaint today of decreased libido and is concerned it is due to her birth control. She would like to try a different pill. 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 She also moved to a new house in the past year. Discussed how stress can affect hormones and recommended increasing stress reduction techniques. She did not follow up in August when she had severe pelvic pain. She suspected a ruptured ovarian cyst. She denied any bleeding at the time. She has not had any pain since then.  LMP: Patient's last menstrual period was 11/05/2018. Menarche:not applicable Average Interval: regular, 28 days Duration of flow: 4 days Heavy Menses: no Clots: no Intermenstrual Bleeding: no Postcoital Bleeding: no Dysmenorrhea: no  The patient is sexually active. She currently uses OCP (estrogen/progesterone) for contraception. She denies dyspareunia.  The patient does perform self breast exams.  There is no notable family history of breast or ovarian cancer in her family.  The patient wears seatbelts: yes.  The patient has regular exercise: She walks to class but denies cardio activity. She has made improvements in her diet and has lost 10 pounds with those changes. She does admit adequate hydration. She denies adequate sleep especially due to school.    The patient denies current symptoms of depression.    Review of Systems: Review of Systems  Constitutional: Negative.   HENT: Negative.   Eyes: Negative.   Respiratory: Negative.   Cardiovascular: Negative.   Gastrointestinal: Negative.     Genitourinary: Negative.   Musculoskeletal: Negative.   Skin: Negative.   Neurological: Negative.   Endo/Heme/Allergies: Negative.   Psychiatric/Behavioral: Negative.     Past Medical History:  Past Medical History:  Diagnosis Date  . Breast abscess 2017   on left as a complication to breast reduction surgery  . 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 History of Papanicolaou smear of cervix 07/08/2015   neg, ct/gc/tr neg;  . Immunization, viral disease    gardasil series completed    Past Surgical History:  Past Surgical History:  Procedure Laterality Date  . BREAST REDUCTION SURGERY  06/2016  . INTRAUTERINE DEVICE (IUD) INSERTION  12/2015   Skyla  . iud removal  08/11/2017    Gynecologic History:  Patient's last menstrual period was 11/05/2018. Contraception: OCP (estrogen/progesterone) Last Pap: 1 year ago Results were:  no abnormalities   Obstetric History: G0P0000  Family History:  Family History  Problem Relation Age of Onset  . Hypertension Father   . Hypertension Maternal Grandmother   . Diabetes Maternal Grandfather   . Hypertension Maternal Grandfather   . Stroke Maternal Grandfather   . Renal Disease Paternal Grandfather   . Hypertension Paternal Uncle     Social History:  Social History   Socioeconomic History  . Marital status: Single    Spouse name: Not on file  . Number of children: 0  . Years of education: 6916  . Highest education level: Not on file  Occupational History  . Occupation: CNA    Comment: pediatrics  at [employer]  Social Needs  . Financial resource strain: Not on file  . Food insecurity:    Worry: Not on file    Inability: Not on file  . Transportation needs:    Medical: Not on file    Non-medical: Not on file  Tobacco Use  . Smoking status: Never Smoker  . Smokeless tobacco: Never Used  Substance and Sexual Activity  . Alcohol use: Yes    Comment: occasional  . Drug use: No  . Sexual activity: Yes     Partners: Male    Birth control/protection: Pill  Lifestyle  . Physical activity:    Days per week: 0 days    Minutes per session: Not on file  . Stress: Not on file  Relationships  . Social connections:    Talks on phone: Not on file    Gets together: Not on file    Attends religious service: Not on file    Active member of club or organization: Not on file    Attends meetings of clubs or organizations: Not on file    Relationship status: Not on file  . Intimate partner violence:    Fear of current or ex partner: Not on file    Emotionally abused: Not on file    Physically abused: Not on file    Forced sexual activity: Not on file  Other Topics Concern  . Not on file  Social History Narrative  . Not on file    Allergies:  No Known Allergies  Medications: Prior to Admission medications   Medication Sig Start Date End Date Taking? Authorizing Provider  norgestimate-ethinyl estradiol (ORTHO-CYCLEN,SPRINTEC,PREVIFEM) 0.25-35 MG-MCG tablet Take 1 tablet by mouth daily. 11/23/18   Tresea Mall, CNM    Physical Exam Vitals: Blood pressure 128/70, pulse 80, height 5\' 6"  (1.676 m), weight 216 lb (98 kg), last menstrual period 11/05/2018.  General: NAD HEENT: normocephalic, anicteric Thyroid: no enlargement, no palpable nodules Pulmonary: No increased work of breathing, CTAB Cardiovascular: RRR, distal pulses 2+ Breast: Breast symmetrical, no tenderness, no palpable nodules or masses, no skin or nipple retraction present, no nipple discharge.  No axillary or supraclavicular lymphadenopathy. Abdomen: NABS, soft, non-tender, non-distended.  Umbilicus without lesions.  No hepatomegaly, splenomegaly or masses palpable. No evidence of hernia  Genitourinary: deferred for no concerns/PAP interval/shared decision making Extremities: no edema, erythema, or tenderness Neurologic: Grossly intact Psychiatric: mood appropriate, affect full    Assessment: 22 y.o. G0P0000 routine  annual exam  Plan: Problem List Items Addressed This Visit    None    Visit Diagnoses    Well woman exam without gynecological exam    -  Primary   Encounter for surveillance of contraceptive pills       Relevant Medications   norgestimate-ethinyl estradiol (ORTHO-CYCLEN,SPRINTEC,PREVIFEM) 0.25-35 MG-MCG tablet      1) 4) Gardasil Series discussed and if applicable offered to patient - Patient has not previously completed 3 shot series   2) STI screening  was offered and declined  3)  ASCCP guidelines and rationale discussed.  Patient opts for every 3 years screening interval  4) Contraception - the patient is currently using  OCP (estrogen/progesterone).  She is happy with continuing OCPs but wants to switch to a higher dose. Sprintec ordered. We discussed safe sex practices to reduce her furture risk of STI's.    5) Return in 1 year (on 11/24/2019) for annual established gyn.   Tresea Mall, CNM [facility], [facility]  11/23/2018, 11:01 AM

## 2018-12-13 IMAGING — CR DG CHEST 2V
2 series · 2 of 2 positions shown · non-contrast
Comparison: None.

CLINICAL DATA: Heart palpitations.  Chest pain.

EXAM:
CHEST  2 VIEW

[chest pa]
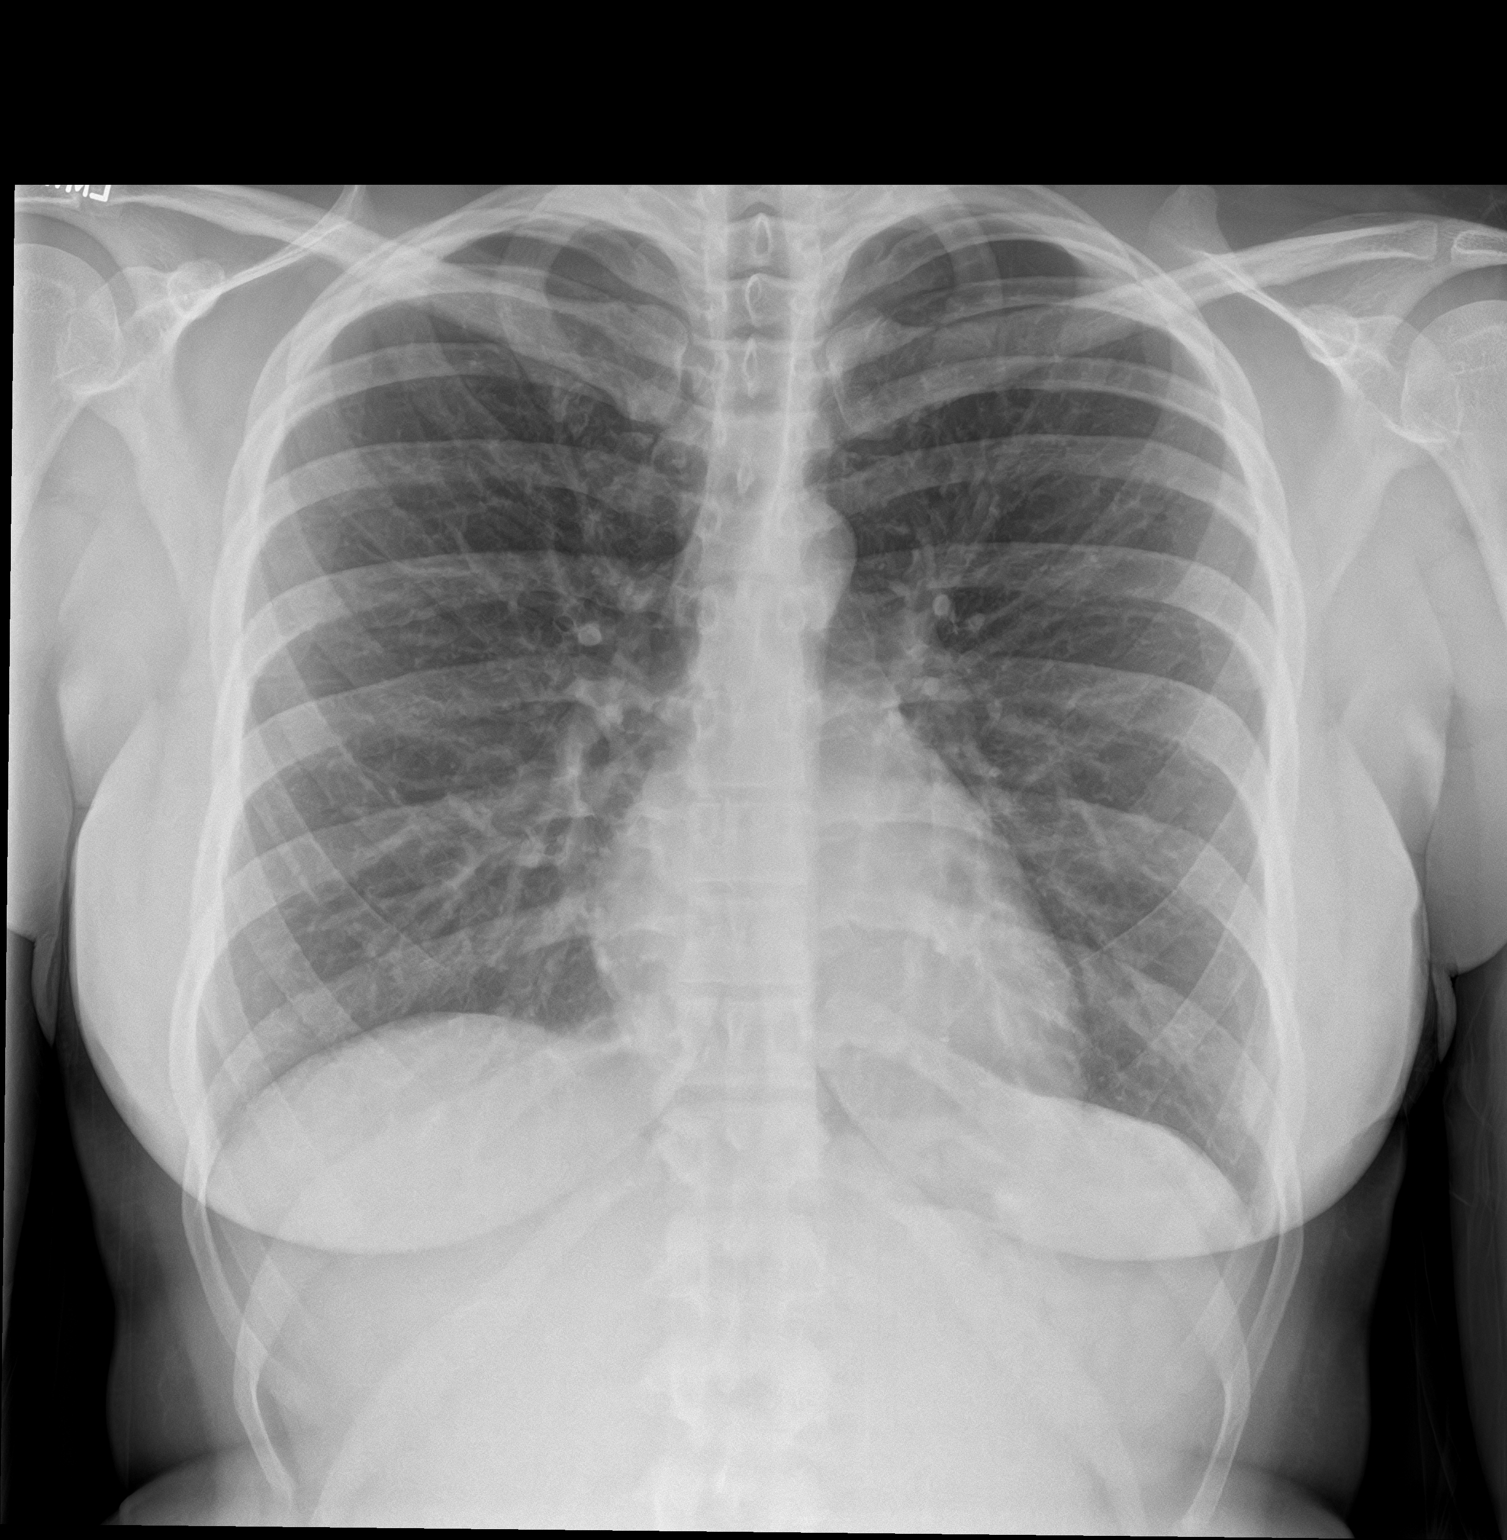

[chest lat]
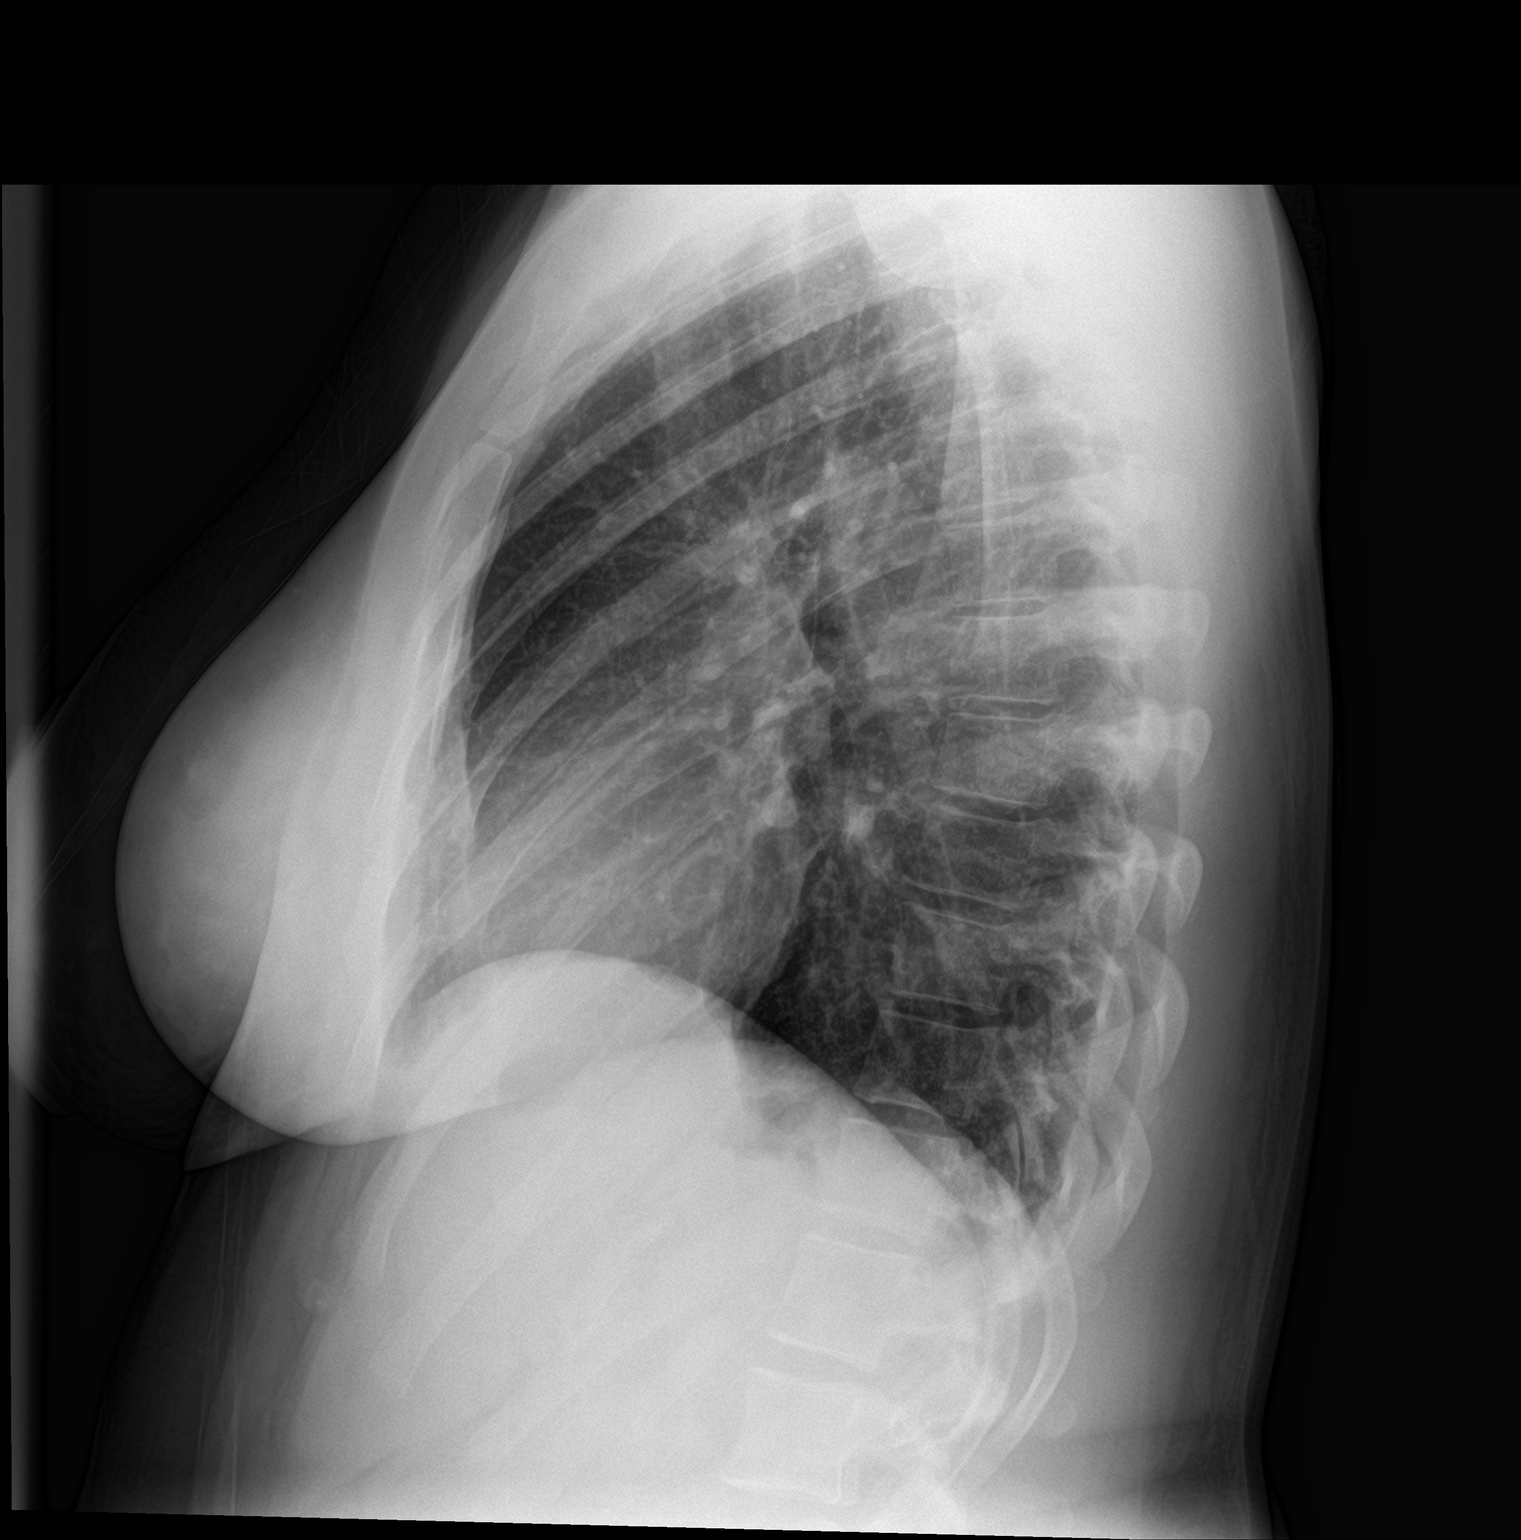

[2 of 2 positions shown; findings below may reference images not displayed]

FINDINGS: The heart size and mediastinal contours are within normal limits.
Both lungs are clear. The visualized skeletal structures are
unremarkable.
IMPRESSION: No active cardiopulmonary disease.

## 2019-11-20 ENCOUNTER — Telehealth: Payer: Self-pay | Admitting: Obstetrics and Gynecology

## 2019-11-20 NOTE — Telephone Encounter (Signed)
Noted. Will order to arrive by apt date/time. 

## 2019-11-20 NOTE — Telephone Encounter (Signed)
Patient is schedule for 12/13/19 in [place] for annual and poss nexplanon placement.

## 2019-12-13 ENCOUNTER — Ambulatory Visit (INDEPENDENT_AMBULATORY_CARE_PROVIDER_SITE_OTHER): Payer: BC Managed Care – PPO | Admitting: Obstetrics and Gynecology

## 2019-12-13 ENCOUNTER — Other Ambulatory Visit: Payer: Self-pay

## 2019-12-13 ENCOUNTER — Other Ambulatory Visit (HOSPITAL_COMMUNITY)
Admission: RE | Admit: 2019-12-13 | Discharge: 2019-12-13 | Disposition: A | Discharge status: Home / Self Care | Payer: BC Managed Care – PPO | Source: Ambulatory Visit | Attending: Obstetrics and Gynecology | Admitting: Obstetrics and Gynecology

## 2019-12-13 ENCOUNTER — Encounter: Payer: Self-pay | Admitting: Obstetrics and Gynecology

## 2019-12-13 VITALS — BP 153/93 | Ht 66.0 in | Wt 232.0 lb

## 2019-12-13 DIAGNOSIS — Z1339 Encounter for screening examination for other mental health and behavioral disorders: Secondary | ICD-10-CM

## 2019-12-13 DIAGNOSIS — Z30017 Encounter for initial prescription of implantable subdermal contraceptive: Secondary | ICD-10-CM | POA: Diagnosis not present

## 2019-12-13 DIAGNOSIS — Z113 Encounter for screening for infections with a predominantly sexual mode of transmission: Secondary | ICD-10-CM | POA: Diagnosis present

## 2019-12-13 DIAGNOSIS — Z01419 Encounter for gynecological examination (general) (routine) without abnormal findings: Secondary | ICD-10-CM

## 2019-12-13 DIAGNOSIS — Z1331 Encounter for screening for depression: Secondary | ICD-10-CM

## 2019-12-13 DIAGNOSIS — Z124 Encounter for screening for malignant neoplasm of cervix: Secondary | ICD-10-CM | POA: Diagnosis present

## 2019-12-13 MED ORDER — NEXPLANON 68 MG ~~LOC~~ IMPL: 1.0000 | DRUG_IMPLANT | Freq: Once | SUBCUTANEOUS | 0 refills | Status: AC

## 2019-12-13 NOTE — Progress Notes (Signed)
Gynecology Annual Exam  PCP: [facility]  Chief Complaint:  Chief Complaint  Patient presents with  . Gynecologic Exam    no concerns  . Contraception    Nexplanon insertion    History of Present Illness:  Ms. ALEXX GIAMBRA is a 24 y.o. G0P0000 who LMP was Patient's last menstrual period was 12/12/2019 (exact date)., presents today for her annual examination.  Her menses are regular every 28-30 days, lasting 4 day(s).  Dysmenorrhea none. She does not have intermenstrual bleeding.  She is sexually active.  She uses combined OCPs for contraception. Denies dyspareunia.  Last Pap: 03/2017.  Results were: no abnormalities /neg HPV DNA not done Hx of STDs: none  There is a FH of breast cancer in her paternal grandmother. There is no FH of ovarian cancer. The patient does do self-breast exams.  Tobacco use: The patient denies current or previous tobacco use. Alcohol use: social drinker Exercise: she exercises 3-4 times per week.  The patient wears seatbelts: yes.   The patient reports that domestic violence in her life is absent.   Past Medical History:  Diagnosis Date  . Breast abscess 2017   on left as a complication to breast reduction surgery  . 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 History of Papanicolaou smear of cervix 07/08/2015   neg, ct/gc/tr neg;  . Immunization, viral disease    gardasil series completed    Past Surgical History:  Procedure Laterality Date  . BREAST REDUCTION SURGERY  06/2016  . INTRAUTERINE DEVICE (IUD) INSERTION  12/2015   Skyla  . iud removal  08/11/2017    Prior to Admission medications   Medication Sig Start Date End Date Taking? Authorizing Provider  norgestimate-ethinyl estradiol (ORTHO-CYCLEN,SPRINTEC,PREVIFEM) 0.25-35 MG-MCG tablet Take 1 tablet by mouth daily. 11/23/18   Tresea Mall, CNM    No Known Allergies  Obstetric History: G0P0000  Social History   Socioeconomic History  .  Marital status: Single    Spouse name: Not on file  . Number of children: 0  . Years of education: 11  . Highest education level: Not on file  Occupational History  . Occupation: CNA    Comment: pediatrics at [employer]  Tobacco Use  . Smoking status: Never Smoker  . Smokeless tobacco: Never Used  Substance and Sexual Activity  . Alcohol use: Yes    Comment: occasional  . Drug use: No  . Sexual activity: Yes    Partners: Male    Birth control/protection: Implant  Other Topics Concern  . Not on file  Social History Narrative  . Not on file   Social Determinants of Health   Financial Resource Strain:   . Difficulty of Paying Living Expenses: Not on file  Food Insecurity:   . Worried About Programme researcher, broadcasting/film/video in the Last Year: Not on file  . Ran Out of Food in the Last Year: Not on file  Transportation Needs:   . Lack of Transportation (Medical): Not on file  . Lack of Transportation (Non-Medical): Not on file  Physical Activity:   . Days of Exercise per Week: Not on file  . Minutes of Exercise per Session: Not on file  Stress:   . Feeling of Stress : Not on file  Social Connections:   . Frequency of Communication with Friends and Family: Not on file  . Frequency of Social Gatherings with Friends and Family: Not on file  . Attends Religious Services: Not  on file  . Active Member of Clubs or Organizations: Not on file  . Attends Banker Meetings: Not on file  . Marital Status: Not on file  Intimate Partner Violence:   . Fear of Current or Ex-Partner: Not on file  . Emotionally Abused: Not on file  . Physically Abused: Not on file  . Sexually Abused: Not on file    Family History  Problem Relation Age of Onset  . Hypertension Father   . Hypertension Maternal Grandmother   . Diabetes Maternal Grandfather   . Hypertension Maternal Grandfather   . Stroke Maternal Grandfather   . Renal Disease Paternal Grandfather   . Hypertension Paternal Uncle   . Breast  cancer Paternal Grandmother 33    Review of Systems  Constitutional: Negative.   HENT: Negative.   Eyes: Negative.   Respiratory: Negative.   Cardiovascular: Negative.   Gastrointestinal: Negative.   Genitourinary: Negative.   Musculoskeletal: Negative.   Skin: Negative.   Neurological: Negative.   Psychiatric/Behavioral: Negative.      Physical Exam BP (!) 153/93   Ht 5\' 6"  (1.676 m)   Wt 232 lb (105.2 kg)   LMP 12/12/2019 (Exact Date)   BMI 37.45 kg/m    Physical Exam Constitutional:      General: She is not in acute distress.    Appearance: Normal appearance. She is well-developed.  Genitourinary:     Pelvic exam was performed with patient supine.     Vulva, urethra, bladder and uterus normal.     No inguinal adenopathy present in the right or left side.    No signs of injury in the vagina.     No vaginal discharge, erythema, tenderness or bleeding.     No cervical motion tenderness, discharge, lesion or polyp.     Uterus is mobile.     Uterus is not enlarged or tender.     No uterine mass detected.    Uterus is anteverted.     No right or left adnexal mass present.     Right adnexa not tender or full.     Left adnexa not tender or full.  HENT:     Head: Normocephalic and atraumatic.  Eyes:     General: No scleral icterus.    Conjunctiva/sclera: Conjunctivae normal.  Neck:     Thyroid: No thyromegaly.  Cardiovascular:     Rate and Rhythm: Normal rate and regular rhythm.     Heart sounds: No murmur. No friction rub. No gallop.   Pulmonary:     Effort: Pulmonary effort is normal. No respiratory distress.     Breath sounds: Normal breath sounds. No wheezing or rales.  Chest:     Breasts:        Right: No inverted nipple, mass, nipple discharge, skin change or tenderness.        Left: No inverted nipple, mass, nipple discharge, skin change or tenderness.  Abdominal:     General: Bowel sounds are normal. There is no distension.     Palpations: Abdomen is  soft. There is no mass.     Tenderness: There is no abdominal tenderness. There is no guarding or rebound.  Musculoskeletal:        General: No swelling or tenderness. Normal range of motion.     Cervical back: Normal range of motion and neck supple.  Lymphadenopathy:     Cervical: No cervical adenopathy.     Lower Body: No right inguinal adenopathy. No left  inguinal adenopathy.  Neurological:     General: No focal deficit present.     Mental Status: She is alert and oriented to person, place, and time.     Cranial Nerves: No cranial nerve deficit.  Skin:    General: Skin is warm and dry.     Findings: No erythema or rash.  Psychiatric:        Mood and Affect: Mood normal.        Behavior: Behavior normal.        Judgment: Judgment normal.    GYNECOLOGY PROCEDURE NOTE  Patient is a 24 y.o. G0P0000 presenting for Nexplanon insertion as her desires means of contraception.  She provided informed consent, signed copy in the chart, time out was performed. Pregnancy test was not done as the patient was on her menses, with self reported LMP of Patient's last menstrual period was 12/12/2019 (exact date).  She understands that Nexplanon is a progesterone only therapy, and that patients often patients have irregular and unpredictable vaginal bleeding or amenorrhea. She understands that other side effects are possible related to systemic progesterone, including but not limited to, headaches, breast tenderness, nausea, and irritability. While effective at preventing pregnancy long acting reversible contraceptives do not prevent transmission of sexually transmitted diseases and use of barrier methods for this purpose was discussed. The placement procedure for Nexplanon was reviewed with the patient in detail including risks of nerve injury, infection, bleeding and injury to other muscles or tendons. She understands that the Nexplanon implant is good for 3 years and needs to be removed at the end of that  time.  She understands that Nexplanon is an extremely effective option for contraception, with failure rate of <1%. This information is reviewed today and all questions were answered. Informed consent was obtained, both verbally and written.   The patient is healthy and has no contraindications to Nexplanon use. Urine pregnancy test was performed today and was negative.  Procedure Appropriate time out taken.  Patient placed in dorsal supine with left arm above head, elbow flexed at 90 degrees, arm resting on examination table with hand behind her head.  The bicipital grove was palpated and site 8-10cm proximal to the medial epicondyle was indentified.  Per the manufacturer's recommendations, the insertion site was marked along a line 3-5 cm posterior (toward the triceps) to the bicipital groove and at 8-10 cm medial to the medial epicondyle. The insertion site was prepped with a two betadine swabs and then injected with 2 cc of 1% lidocaine without epinephrine.  Nexplanon removed form sterile blister packaging,  Device confirmed in needle, before inserting full length of needle, tenting up the skin as the needle was advance.  The drug eluting rod was then deployed by pulling back the slider per the manufactures recommendation.  The implant was palpable by the clinician as well as the patient.  The insertion site covered dressed with a 1/2" steri-strip before applying  a kerlex bandage pressure dressing..Minimal blood loss was noted during the procedure.  The patient tolerated the procedure well.   She was instructed to wear the bandage for 24 hours, call with any signs of infection.  She was given the Nexplanon card and instructed to have the rod removed in 3 years.  Female chaperone present for pelvic and breast  portions of the physical exam  Results: AUDIT Questionnaire (screen for alcoholism): 4 PHQ-9: 0   Assessment: 24 y.o. G0P0000 female here for routine annual gynecologic  examination  Plan: Problem  List Items Addressed This Visit    None    Visit Diagnoses    Women's annual routine gynecological examination    -  Primary   Relevant Medications   etonogestrel (NEXPLANON) 25 MG IMPL implant   Other Relevant Orders   Cytology - PAP   Screening for depression       Screening for alcoholism       Screen for STD (sexually transmitted disease)       Relevant Orders   Cytology - PAP   Pap smear for cervical cancer screening       Relevant Orders   Cytology - PAP   Nexplanon insertion       Relevant Medications   etonogestrel (NEXPLANON) 68 MG IMPL implant      Screening: -- Blood pressure screen elevated: continued to monitor. -- Weight screening: obese: discussed management options, including lifestyle, dietary, and exercise. -- Depression screening negative (PHQ-9) -- Nutrition: normal -- cholesterol screening: not due for screening -- osteoporosis screening: not due -- tobacco screening: not using -- alcohol screening: AUDIT questionnaire indicates low-risk usage. -- family history of breast cancer screening: done. not at high risk. -- no evidence of domestic violence or intimate partner violence. -- STD screening: gonorrhea/chlamydia NAAT collected -- pap smear collected per ASCCP guidelines -- flu vaccine received in September -- HPV vaccination series: received  Prentice Docker, MD 12/13/2019 2:24 PM

## 2019-12-17 LAB — CYTOLOGY - PAP
Chlamydia: NEGATIVE
Comment: NEGATIVE
Comment: NORMAL
Diagnosis: NEGATIVE
Neisseria Gonorrhea: NEGATIVE

## 2020-02-08 ENCOUNTER — Other Ambulatory Visit: Payer: Self-pay | Admitting: Advanced Practice Midwife

## 2020-02-08 DIAGNOSIS — Z3041 Encounter for surveillance of contraceptive pills: Secondary | ICD-10-CM

## 2020-02-18 NOTE — Telephone Encounter (Signed)
Nexplanon rcvd/charged 12/13/2019
